# Patient Record
Sex: Male | Born: 2000 | Race: Black or African American | Hispanic: No | Marital: Single | Smoking: Never smoker
Health system: Southern US, Community
[De-identification: ages and names within clinical notes are randomized; demographics above are authoritative.]

---
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
## 2022-03-05 ENCOUNTER — Telehealth: Payer: Self-pay | Admitting: Pediatrics

## 2022-03-05 NOTE — Telephone Encounter (Signed)
I attempted 3 separate phone calls on 3 consecutive days to inform patient and/or patient's caregiver that they received a COVID-19 vaccination past it's effective date based on the length of time out of the deep freezer from [facility]. Contact with the patient/caregiver was unsuccessful. A letter will be sent to inform the patient of their ability to become revaccinated free of charge.   

## 2023-01-11 ENCOUNTER — Ambulatory Visit: Payer: Medicaid Other | Attending: Nurse Practitioner | Admitting: Nurse Practitioner

## 2023-01-11 ENCOUNTER — Encounter: Payer: Self-pay | Admitting: Nurse Practitioner

## 2023-01-11 VITALS — BP 108/67 | HR 54 | Ht 74.0 in | Wt 167.0 lb

## 2023-01-11 DIAGNOSIS — Z7689 Persons encountering health services in other specified circumstances: Secondary | ICD-10-CM

## 2023-01-11 DIAGNOSIS — Z0000 Encounter for general adult medical examination without abnormal findings: Secondary | ICD-10-CM | POA: Diagnosis not present

## 2023-01-11 NOTE — Progress Notes (Signed)
Assessment & Plan:  Colton Banks was seen today for new patient (initial visit).  Diagnoses and all orders for this visit:  Encounter to establish care Follow-up for physical and labs   Patient has been counseled on age-appropriate routine health concerns for screening and prevention. These are reviewed and up-to-date. Referrals have been placed accordingly. Immunizations are up-to-date or declined.    Subjective:   Chief Complaint  Patient presents with   New Patient (Initial Visit)   HPI Colton Banks 22 y.o. male presents to office today to establish care  Colton Banks is a 22 year old male who identifies as male.  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  He does not have any significant past medical history and reports no concerns today.      BP Readings from Last 3 Encounters:  01/11/23 108/67  11/26/20 116/70 (41 %, Z = -0.23 /  48 %, Z = -0.05)*  01/23/20 (!) 104/61      Review of Systems  Constitutional:  Negative for fever, malaise/fatigue and weight loss.  HENT: Negative.  Negative for nosebleeds.   Eyes: Negative.  Negative for blurred vision, double vision and photophobia.  Respiratory: Negative.  Negative for cough and shortness of breath.   Cardiovascular: Negative.  Negative for chest pain, palpitations and leg swelling.  Gastrointestinal: Negative.  Negative for heartburn, nausea and vomiting.  Musculoskeletal: Negative.  Negative for myalgias.  Neurological: Negative.  Negative for dizziness, focal weakness, seizures and headaches.  Psychiatric/Behavioral: Negative.  Negative for suicidal ideas.     No past medical history on file.  No past surgical history on file.  Family History  Problem Relation Age of Onset   Healthy Mother    Diabetes Mother    Healthy Sister     Social History Reviewed with no changes to be made today.   No outpatient medications prior to visit.   No facility-administered  medications prior to visit.    No Known Allergies     Objective:    BP 108/67 (BP Location: Right Arm, Patient Position: Sitting, Cuff Size: Normal)   Pulse (!) 54   Ht 6\' 2"  (1.88 m)   Wt 167 lb (75.8 kg)   SpO2 100%   BMI 21.44 kg/m  Wt Readings from Last 3 Encounters:  01/11/23 167 lb (75.8 kg) (68 %, Z= 0.48)*  11/26/20 156 lb 9.6 oz (71 kg) (68 %, Z= 0.46)*  02/05/19 110 lb (49.9 kg) (16 %, Z= -1.01)*   * Growth percentiles are based on CDC (Boys, 2-20 Years) data.    Physical Exam Vitals and nursing note reviewed.  Constitutional:      Appearance: He is well-developed.  HENT:     Head: Normocephalic and atraumatic.  Cardiovascular:     Rate and Rhythm: Regular rhythm. Bradycardia present.     Heart sounds: Normal heart sounds. No murmur heard.    No friction rub. No gallop.  Pulmonary:     Effort: Pulmonary effort is normal. No tachypnea or respiratory distress.     Breath sounds: Normal breath sounds. No decreased breath sounds, wheezing, rhonchi or rales.  Chest:     Chest wall: No tenderness.  Abdominal:     General: Bowel sounds are normal.     Palpations: Abdomen is soft.  Musculoskeletal:        General: Normal range of motion.     Cervical back: Normal range of motion.  Skin:  General: Skin is warm and dry.  Neurological:     Mental Status: He is alert and oriented to person, place, and time.     Coordination: Coordination normal.  Psychiatric:        Behavior: Behavior normal. Behavior is cooperative.        Thought Content: Thought content normal.        Judgment: Judgment normal.          Patient has been counseled extensively about nutrition and exercise as well as the importance of adherence with medications and regular follow-up. The patient was given clear instructions to go to ER or return to medical center if symptoms don't improve, worsen or new problems develop. The patient verbalized understanding.   Follow-up: Return for  physical.   Claiborne Rigg, FNP-BC [facility] [place], [place] [phone]   01/11/2023, 11:58 AM

## 2023-02-26 ENCOUNTER — Encounter: Payer: Medicaid Other | Admitting: Nurse Practitioner

## 2023-04-30 ENCOUNTER — Encounter: Payer: Medicaid Other | Admitting: Nurse Practitioner
